# Patient Record
Sex: Male | Born: 1960 | Race: White | Hispanic: Yes | Marital: Married | Smoking: Never smoker
Health system: Southern US, Community
[De-identification: ages and names within clinical notes are randomized; demographics above are authoritative.]

## PROBLEM LIST (undated history)

## (undated) DIAGNOSIS — I10 Essential (primary) hypertension: Secondary | ICD-10-CM

## (undated) DIAGNOSIS — C8400 Mycosis fungoides, unspecified site: Secondary | ICD-10-CM

---

## 2008-04-16 ENCOUNTER — Ambulatory Visit: Payer: Self-pay | Admitting: Family Medicine

## 2008-04-16 ENCOUNTER — Ambulatory Visit (HOSPITAL_COMMUNITY): Admission: RE | Admit: 2008-04-16 | Discharge: 2008-04-16 | Payer: Self-pay | Admitting: Family Medicine

## 2008-04-16 DIAGNOSIS — E785 Hyperlipidemia, unspecified: Secondary | ICD-10-CM

## 2008-04-16 DIAGNOSIS — I10 Essential (primary) hypertension: Secondary | ICD-10-CM

## 2008-04-16 DIAGNOSIS — R21 Rash and other nonspecific skin eruption: Secondary | ICD-10-CM | POA: Insufficient documentation

## 2008-04-23 ENCOUNTER — Encounter: Payer: Self-pay | Admitting: Family Medicine

## 2008-04-27 ENCOUNTER — Encounter (INDEPENDENT_AMBULATORY_CARE_PROVIDER_SITE_OTHER): Payer: Self-pay | Admitting: Family Medicine

## 2008-04-27 ENCOUNTER — Ambulatory Visit: Payer: Self-pay | Admitting: Family Medicine

## 2008-04-30 LAB — CONVERTED CEMR LAB
Albumin: 4.6 g/dL (ref 3.5–5.2)
Alkaline Phosphatase: 96 units/L (ref 39–117)
BUN: 22 mg/dL (ref 6–23)
Calcium: 9.8 mg/dL (ref 8.4–10.5)
Chloride: 100 meq/L (ref 96–112)
Creatinine, Ser: 1.06 mg/dL (ref 0.40–1.50)
Glucose, Bld: 84 mg/dL (ref 70–99)
HDL: 41 mg/dL (ref >39)
Potassium: 4 meq/L (ref 3.5–5.3)
Total CHOL/HDL Ratio: 5.6
Triglycerides: 123 mg/dL (ref <150)

## 2009-02-04 ENCOUNTER — Ambulatory Visit: Payer: Self-pay | Admitting: Family Medicine

## 2009-02-04 DIAGNOSIS — M79609 Pain in unspecified limb: Secondary | ICD-10-CM

## 2009-02-15 ENCOUNTER — Ambulatory Visit: Payer: Self-pay | Admitting: Family Medicine

## 2009-02-15 ENCOUNTER — Telehealth: Payer: Self-pay | Admitting: Family Medicine

## 2009-02-15 DIAGNOSIS — M5412 Radiculopathy, cervical region: Secondary | ICD-10-CM | POA: Insufficient documentation

## 2010-06-16 ENCOUNTER — Encounter: Payer: Self-pay | Admitting: Family Medicine

## 2010-07-09 ENCOUNTER — Encounter: Payer: Self-pay | Admitting: *Deleted

## 2016-10-27 ENCOUNTER — Ambulatory Visit (HOSPITAL_COMMUNITY)
Admission: EM | Admit: 2016-10-27 | Discharge: 2016-10-27 | Disposition: A | Discharge status: Home / Self Care | Payer: Managed Care, Other (non HMO) | Attending: Internal Medicine | Admitting: Internal Medicine

## 2016-10-27 ENCOUNTER — Encounter (HOSPITAL_COMMUNITY): Payer: Self-pay | Admitting: Family Medicine

## 2016-10-27 DIAGNOSIS — M5431 Sciatica, right side: Secondary | ICD-10-CM | POA: Diagnosis not present

## 2016-10-27 HISTORY — DX: Essential (primary) hypertension: I10

## 2016-10-27 MED ORDER — PREDNISONE 10 MG PO TABS: ORAL_TABLET | ORAL | 0 refills | Status: AC

## 2016-10-27 MED ORDER — HYDROCODONE-ACETAMINOPHEN 5-325 MG PO TABS
2.0000 | ORAL_TABLET | ORAL | 0 refills | Status: DC | PRN
Start: 2016-10-27 — End: 2018-05-29

## 2016-10-27 NOTE — ED Provider Notes (Signed)
CSN: [id number]     Arrival date & time 10/27/16  1458 History   None    Chief Complaint  Patient presents with  . Leg Pain   (Consider location/radiation/quality/duration/timing/severity/associated sxs/prior Treatment) The history is provided by the patient. No language interpreter was used.  Leg Pain  Location:  Hip and buttock Time since incident:  2 months Injury: no   Hip location:  R hip Buttock location:  R buttock Pain details:    Quality:  Aching   Radiates to:  Does not radiate   Severity:  Moderate   Onset quality:  Gradual   Duration:  2 months   Timing:  Constant Chronicity:  New Relieved by:  Nothing Worsened by:  Nothing Associated symptoms: no back pain    Pt reports pain goes down his leg,  Pt reports pain in his buttock.  Past Medical History:  Diagnosis Date  . Hypertension    History reviewed. No pertinent surgical history. History reviewed. No pertinent family history. Social History  Substance Use Topics  . Smoking status: Never Smoker  . Smokeless tobacco: Never Used  . Alcohol use Not on file    Review of Systems  Musculoskeletal: Negative for back pain.  All other systems reviewed and are negative.   Allergies  Patient has no known allergies.  Home Medications   Prior to Admission medications   Medication Sig Start Date End Date Taking? Authorizing Provider  gabapentin (NEURONTIN) 100 MG capsule Take 100 mg by mouth. At bedtime for 2-3 days then 2 at bedtime for 2-3 days then 3 at bedtime     [provider]  HYDROcodone-acetaminophen (NORCO/VICODIN) 5-325 MG tablet Take 2 tablets by mouth every 4 (four) hours as needed. 10/27/16   Fransico Meadow, PA-C  ibuprofen (ADVIL,MOTRIN) 600 MG tablet Take 600 mg by mouth every 8 (eight) hours.      [provider]  lisinopril-hydrochlorothiazide (PRINZIDE,ZESTORETIC) 20-25 MG per tablet Take 1 tablet by mouth daily.      [provider]  predniSONE (DELTASONE) 10 MG  tablet 6,5,4,3,2,1 taper 10/27/16   Caryl Ada K, PA-C  triamcinolone (KENALOG) 0.1 % ointment Apply topically 2 (two) times daily. To arms over rash. 34 grm     [provider]   Meds Ordered and Administered this Visit  Medications - No data to display  BP 127/87   Pulse 92   Temp 98.5 F (36.9 C)   Resp 18   SpO2 100%  No data found.   Physical Exam  Constitutional: He appears well-developed and well-nourished.  Cardiovascular: Normal rate.   Pulmonary/Chest: Effort normal.  Musculoskeletal: He exhibits tenderness.  Tender sciatic notch,   Pt has a large wallet in pocket on right side,  From  nv and ns intact  Neurological: He is alert.  Skin: Skin is warm.  Psychiatric: He has a normal mood and affect.  Nursing note and vitals reviewed.   Urgent Care Course     Procedures (including critical care time)  Labs Review Labs Reviewed - No data to display  Imaging Review No results found.   Visual Acuity Review  Right Eye Distance:   Left Eye Distance:   Bilateral Distance:    Right Eye Near:   Left Eye Near:    Bilateral Near:         MDM   1. Sciatica of right side    Pt advised to stop carrying/sitting on wallet,  Pt counseled on sciatica.  Meds ordered this encounter  Medications  . predniSONE (DELTASONE) 10 MG tablet    Sig: 6,5,4,3,2,1 taper    Dispense:  21 tablet    Refill:  0    Order Specific Question:   Supervising Provider    Answer:   Sherlene Shams [[id number]]  . HYDROcodone-acetaminophen (NORCO/VICODIN) 5-325 MG tablet    Sig: Take 2 tablets by mouth every 4 (four) hours as needed.    Dispense:  16 tablet    Refill:  0    Order Specific Question:   Supervising Provider    Answer:   Sherlene Shams [[id number]]   An After Visit Summary was printed and given to the patient. Schedule to see Dr. Percell Miller for evaluation.    Fransico Meadow, [place] 10/27/16 [phone]

## 2016-10-27 NOTE — Discharge Instructions (Signed)
Do not keep your wallet in your back pocket

## 2016-10-27 NOTE — ED Triage Notes (Signed)
Pt here for right leg pain that is worsening overt the past 2 months.

## 2016-12-31 ENCOUNTER — Ambulatory Visit: Payer: Managed Care, Other (non HMO) | Attending: Family Medicine | Admitting: Licensed Clinical Social Worker

## 2016-12-31 ENCOUNTER — Encounter: Payer: Self-pay | Admitting: Family Medicine

## 2016-12-31 ENCOUNTER — Ambulatory Visit: Payer: Managed Care, Other (non HMO) | Attending: Family Medicine | Admitting: Family Medicine

## 2016-12-31 VITALS — BP 163/96 | HR 82 | Temp 98.0°F | Resp 18 | Ht 68.0 in | Wt 163.4 lb

## 2016-12-31 DIAGNOSIS — Z0000 Encounter for general adult medical examination without abnormal findings: Secondary | ICD-10-CM

## 2016-12-31 DIAGNOSIS — Z0001 Encounter for general adult medical examination with abnormal findings: Secondary | ICD-10-CM | POA: Insufficient documentation

## 2016-12-31 DIAGNOSIS — Z8249 Family history of ischemic heart disease and other diseases of the circulatory system: Secondary | ICD-10-CM | POA: Insufficient documentation

## 2016-12-31 DIAGNOSIS — Z79899 Other long term (current) drug therapy: Secondary | ICD-10-CM | POA: Insufficient documentation

## 2016-12-31 DIAGNOSIS — F419 Anxiety disorder, unspecified: Secondary | ICD-10-CM

## 2016-12-31 DIAGNOSIS — L400 Psoriasis vulgaris: Secondary | ICD-10-CM | POA: Diagnosis not present

## 2016-12-31 DIAGNOSIS — I10 Essential (primary) hypertension: Secondary | ICD-10-CM

## 2016-12-31 MED ORDER — CLOBETASOL PROPIONATE 0.05 % EX CREA: 1.0000 "application " | TOPICAL_CREAM | Freq: Two times a day (BID) | CUTANEOUS | 1 refills | Status: AC

## 2016-12-31 MED ORDER — LISINOPRIL-HYDROCHLOROTHIAZIDE 20-25 MG PO TABS: 1.0000 | ORAL_TABLET | Freq: Every day | ORAL | 3 refills | Status: AC

## 2016-12-31 MED ORDER — CITALOPRAM HYDROBROMIDE 10 MG PO TABS: 10.0000 mg | ORAL_TABLET | Freq: Every day | ORAL | 1 refills | Status: AC

## 2016-12-31 NOTE — Progress Notes (Signed)
Patient is here for physical/HTN

## 2016-12-31 NOTE — BH Specialist Note (Signed)
Integrated Behavioral Health Initial Visit  MRN: [id number] Name: Nathan Francis   Session Start time: 11:45 Session End time: 12:00 PM Total time: 15 minutes  Type of Service: [place] Interpretor:No. Interpretor Name and Language: N/A   Warm Hand Off Completed.       SUBJECTIVE: Nathan Francis is a 56 y.o. male accompanied by patient. Patient was referred by FNP [place] for anxiety. Patient reports the following symptoms/concerns: feeling worried, difficulty sleeping, and low energy Duration of problem: A month; Severity of problem: mild  OBJECTIVE: Mood: Pleasant and Affect: Appropriate Risk of harm to self or others: No plan to harm self or others   LIFE CONTEXT: Family and Social: Pt receives support from wife and children. He has a sister who resides in [place], [place] that he visits with School/Work: Pt is employed Self-Care: Pt talks with spouse. No report of substance use Life Changes: None indicated  GOALS ADDRESSED: Patient will reduce symptoms of: anxiety and increase knowledge and/or ability of: coping skills and also: Increase adequate support systems for patient/family   INTERVENTIONS: Mindfulness or Relaxation Training, Supportive Counseling and Psychoeducation and/or Health Education  Standardized Assessments completed: GAD-7 and PHQ 2&9  ASSESSMENT: Patient currently experiencing anxiety. He reports feeling worried, difficulty sleeping, and low energy. Patient may benefit from psychotherapy. LCSWA educated pt on the correlation between one's physical and mental health. LCSWA discussed relaxation techniques that can assist in decreasing symptoms. Pt receives support from family. He is not receptive to initiating medication management or psychotherapy at this time. Crisis resources were discussed.   PLAN: 1. Follow up with behavioral health clinician on : Pt was encouraged to contact LCSWA if symptoms worsen or fail to  improve to schedule behavioral appointments at [facility]. 2. Behavioral recommendations: LCSWA recommends that pt apply healthy coping skills discussed. Pt is encouraged to schedule follow up appointment with LCSWA 3. Referral(s):  4. "From scale of 1-10, how likely are you to follow plan?": 10/10  Rebekah Chesterfield, LCSW 01/01/17 2:04 PM

## 2016-12-31 NOTE — Patient Instructions (Addendum)
Psoriasis Psoriasis is a long-term (chronic) skin condition. It causes raised, red patches (plaques) on your skin that look silvery. The red patches may show up anywhere on your body. They can be any size or shape. Psoriasis can come and go. It can range from mild to very bad. It cannot be passed from one person to another (not contagious). There is no cure for this condition, but it can be helped with treatment. Follow these instructions at home: Skin Care  Apply moisturizers to your skin as needed. Only use those that your doctor has said are okay.  Apply cool compresses to the affected areas.  Do not scratch your skin. Lifestyle   Do not use tobacco products. This includes cigarettes, chewing tobacco, and e-cigarettes. If you need help quitting, ask your doctor.  Drink little or no alcohol.  Try to lower your stress. Meditation or yoga may help.  Get sun as told by your doctor. Do not get sunburned.  Think about joining a psoriasis support group. Medicines  Take or use over-the-counter and prescription medicines only as told by your doctor.  If you were prescribed an antibiotic, take or use it as told by your doctor. Do not stop taking the antibiotic even if your condition starts to get better. General instructions  Keep a journal. Use this to help track what triggers an outbreak. Try to avoid any triggers.  See a counselor or social worker if you feel very sad, upset, or hopeless about your condition and these feelings affect your work or relationships.  Keep all follow-up visits as told by your doctor. This is important. Contact a doctor if:  Your pain gets worse.  You have more redness or warmth in the affected areas.  You have new pain or stiffness in your joints.  Your pain or stiffness in your joints gets worse.  Your nails start to break easily.  Your nails pull away from the nail bed easily.  You have a fever.  You feel very sad (depressed). This  information is not intended to replace advice given to you by your health care provider. Make sure you discuss any questions you have with your health care provider. Document Released: 06/18/2004 Document Revised: 10/17/2015 Document Reviewed: 09/26/2014 Elsevier Interactive Patient Education  2018 [employer] After being diagnosed with an anxiety disorder, you may be relieved to know why you have felt or behaved a certain way. It is natural to also feel overwhelmed about the treatment ahead and what it will mean for your life. With care and support, you can manage this condition and recover from it. How to cope with anxiety Dealing with stress Stress is your body's reaction to life changes and events, both good and bad. Stress can last just a few hours or it can be ongoing. Stress can play a major role in anxiety, so it is important to learn both how to cope with stress and how to think about it differently. Talk with your health care provider or a counselor to learn more about stress reduction. He or she may suggest some stress reduction techniques, such as:  Music therapy. This can include creating or listening to music that you enjoy and that inspires you.  Mindfulness-based meditation. This involves being aware of your normal breaths, rather than trying to control your breathing. It can be done while sitting or walking.  Centering prayer. This is a kind of meditation that involves focusing on a word, phrase, or  sacred image that is meaningful to you and that brings you peace.  Deep breathing. To do this, expand your stomach and inhale slowly through your nose. Hold your breath for 3-5 seconds. Then exhale slowly, allowing your stomach muscles to relax.  Self-talk. This is a skill where you identify thought patterns that lead to anxiety reactions and correct those thoughts.  Muscle relaxation. This involves tensing muscles then relaxing them.  Choose a stress  reduction technique that fits your lifestyle and personality. Stress reduction techniques take time and practice. Set aside 5-15 minutes a day to do them. Therapists can offer training in these techniques. The training may be covered by some insurance plans. Other things you can do to manage stress include:  Keeping a stress diary. This can help you learn what triggers your stress and ways to control your response.  Thinking about how you respond to certain situations. You may not be able to control everything, but you can control your reaction.  Making time for activities that help you relax, and not feeling guilty about spending your time in this way.  Therapy combined with coping and stress-reduction skills provides the best chance for successful treatment. Medicines Medicines can help ease symptoms. Medicines for anxiety include:  Anti-anxiety drugs.  Antidepressants.  Beta-blockers.  Medicines may be used as the main treatment for anxiety disorder, along with therapy, or if other treatments are not working. Medicines should be prescribed by a health care provider. Relationships Relationships can play a big part in helping you recover. Try to spend more time connecting with trusted friends and family members. Consider going to couples counseling, taking family education classes, or going to family therapy. Therapy can help you and others better understand the condition. How to recognize changes in your condition Everyone has a different response to treatment for anxiety. Recovery from anxiety happens when symptoms decrease and stop interfering with your daily activities at home or work. This may mean that you will start to:  Have better concentration and focus.  Sleep better.  Be less irritable.  Have more energy.  Have improved memory.  It is important to recognize when your condition is getting worse. Contact your health care provider if your symptoms interfere with home or  work and you do not feel like your condition is improving. Where to find help and support: You can get help and support from these sources:  Self-help groups.  Online and [employer].  A trusted spiritual leader.  Couples counseling.  Family education classes.  Family therapy.  Follow these instructions at home:  Eat a healthy diet that includes plenty of vegetables, fruits, whole grains, low-fat dairy products, and lean protein. Do not eat a lot of foods that are high in solid fats, added sugars, or salt.  Exercise. Most adults should do the following: ? Exercise for at least 150 minutes each week. The exercise should increase your heart rate and make you sweat (moderate-intensity exercise). ? Strengthening exercises at least twice a week.  Cut down on caffeine, tobacco, alcohol, and other potentially harmful substances.  Get the right amount and quality of sleep. Most adults need 7-9 hours of sleep each night.  Make choices that simplify your life.  Take over-the-counter and prescription medicines only as told by your health care provider.  Avoid caffeine, alcohol, and certain over-the-counter cold medicines. These may make you feel worse. Ask your pharmacist which medicines to avoid.  Keep all follow-up visits as told by your health care  provider. This is important. Questions to ask your health care provider  Would I benefit from therapy?  How often should I follow up with a health care provider?  How long do I need to take medicine?  Are there any long-term side effects of my medicine?  Are there any alternatives to taking medicine? Contact a health care provider if:  You have a hard time staying focused or finishing daily tasks.  You spend many hours a day feeling worried about everyday life.  You become exhausted by worry.  You start to have headaches, feel tense, or have nausea.  You urinate more than normal.  You have diarrhea. Get help  right away if:  You have a racing heart and shortness of breath.  You have thoughts of hurting yourself or others. If you ever feel like you may hurt yourself or others, or have thoughts about taking your own life, get help right away. You can go to your nearest emergency department or call:  Your local emergency services (911 in the [place]  A suicide crisis helpline, such as the [place] at 406-358-4902. This is open 24-hours a day.  Summary  Taking steps to deal with stress can help calm you.  Medicines cannot cure anxiety disorders, but they can help ease symptoms.  Family, friends, and partners can play a big part in helping you recover from an anxiety disorder. This information is not intended to replace advice given to you by your health care provider. Make sure you discuss any questions you have with your health care provider. Document Released: 05/05/2016 Document Revised: 05/05/2016 Document Reviewed: 05/05/2016 Elsevier Interactive Patient Education  [employer].

## 2016-12-31 NOTE — Progress Notes (Signed)
Subjective:   Patient ID: Nathan Francis, male    DOB: [birth year], 58 y.o.   MRN: [id number]  Chief Complaint  Patient presents with  . Hypertension  . Annual Exam   HPI Nathan Francis 56 y.o. male presents for  a comprehensive physical exam. He reports history of psorasis. Symptoms have been ongoing for about 5 years and have been gradually worsening. The patient reports symptoms of scaling, primarily affecting the elbows, arms, knees, legs, trunk, abdomen, hip. Lesions appear to be exacerbated by no known precipitant. Treatments tried so far include moisturizers, with inadequate control of symptoms. He reports history of dermatology consult in the past and topical steroid use.  History of other significant skin problems: no. Family History of skin disease: no. History of hypertension. He is not exercising and is not adherent to low salt diet.  He does not check BP at home. Cardiac symptoms none. Patient denies chest pain, chest pressure/discomfort, claudication, dyspnea, lower extremity edema, near-syncope, palpitations and syncope.  Cardiovascular risk factors: advanced age (older than 69 for men, 66 for women), hypertension, male gender and smoking/ tobacco exposure. Use of agents associated with hypertension: none. History of target organ damage: none. Family history of HTN-mother,father. He denies any family history of diabetes or cancer.He complains of anxious mood.  He has the following symptoms: difficulty concentrating, feelings of losing control, racing thoughts. Onset of symptoms was approximately several years ago, unchanged since that time. He denies current suicidal and homicidal ideation.  Possible organic causes contributing are: none. Risk factors: none Previous treatment includes none . He is agreeable to speaking with LCSW at this time and is requesting medication for symptoms.   Past Medical History:  Diagnosis Date  . Hypertension     No past surgical history on file.  No family  history on file.  Social History   Social History  . Marital status: Married    Spouse name: N/A  . Number of children: N/A  . Years of education: N/A   Occupational History  . Not on file.   Social History Main Topics  . Smoking status: Never Smoker  . Smokeless tobacco: Never Used  . Alcohol use Not on file  . Drug use: Unknown  . Sexual activity: Not on file   Other Topics Concern  . Not on file   Social History Narrative  . No narrative on file    Outpatient Medications Prior to Visit  Medication Sig Dispense Refill  . gabapentin (NEURONTIN) 100 MG capsule Take 100 mg by mouth. At bedtime for 2-3 days then 2 at bedtime for 2-3 days then 3 at bedtime     . HYDROcodone-acetaminophen (NORCO/VICODIN) 5-325 MG tablet Take 2 tablets by mouth every 4 (four) hours as needed. 16 tablet 0  . ibuprofen (ADVIL,MOTRIN) 600 MG tablet Take 600 mg by mouth every 8 (eight) hours.      . predniSONE (DELTASONE) 10 MG tablet 6,5,4,3,2,1 taper 21 tablet 0  . triamcinolone (KENALOG) 0.1 % ointment Apply topically 2 (two) times daily. To arms over rash. 30 grm     . lisinopril-hydrochlorothiazide (PRINZIDE,ZESTORETIC) 20-25 MG per tablet Take 1 tablet by mouth daily.       No facility-administered medications prior to visit.     No Known Allergies  Review of Systems  Constitutional: Negative.   HENT: Negative.   Eyes: Negative.   Respiratory: Negative.   Cardiovascular: Negative.   Gastrointestinal: Negative.   Genitourinary: Negative.   Musculoskeletal: Negative.   Skin:  Positive for rash (psorasis).  Neurological: Negative.   Endo/Heme/Allergies: Negative.   Psychiatric/Behavioral: The patient is nervous/anxious.        Objective:    Physical Exam  Constitutional: He is oriented to person, place, and time. He appears well-developed and well-nourished.  HENT:  Head: Normocephalic and atraumatic.  Right Ear: External ear normal.  Left Ear: External ear normal.  Nose:  Nose normal.  Mouth/Throat: Oropharynx is clear and moist.  Eyes: Pupils are equal, round, and reactive to light. Conjunctivae and EOM are normal.  Neck: Normal range of motion. Neck supple.  Cardiovascular: Normal rate, regular rhythm, normal heart sounds and intact distal pulses.   Pulmonary/Chest: Effort normal and breath sounds normal.  Abdominal: Soft. Bowel sounds are normal.  Musculoskeletal: Normal range of motion.  Neurological: He is alert and oriented to person, place, and time. He has normal reflexes.  Skin: Skin is warm and dry. Rash (generalized red, scaly plaques) noted.  Psychiatric: He has a normal mood and affect. His behavior is normal. Judgment and thought content normal.  Nursing note and vitals reviewed.   BP (!) 163/96 (BP Location: Left Arm, Patient Position: Sitting, Cuff Size: Normal)   Pulse 82   Temp 98 F (36.7 C) (Oral)   Resp 18   Ht _0  (1.727 m)   Wt 163 lb 6.4 oz (74.1 kg)   SpO2 97%   BMI 24.84 kg/m  Wt Readings from Last 3 Encounters:  12/31/16 163 lb 6.4 oz (74.1 kg)  02/15/09 165 lb (74.8 kg)  02/04/09 170 lb (77.1 kg)      Lab Results  Component Value Date   TSH 2.770 12/31/2016   Lab Results  Component Value Date   WBC 7.2 12/31/2016   HGB 14.7 12/31/2016   HCT 44.9 12/31/2016   MCV 92 12/31/2016   PLT 373 12/31/2016   Lab Results  Component Value Date   NA 140 12/31/2016   K 4.7 12/31/2016   CO2 24 12/31/2016   GLUCOSE 81 12/31/2016   BUN 18 12/31/2016   CREATININE 1.07 12/31/2016   BILITOT 0.7 12/31/2016   ALKPHOS 89 12/31/2016   AST 18 12/31/2016   ALT 21 12/31/2016   PROT 7.2 12/31/2016   ALBUMIN 4.6 12/31/2016   CALCIUM 9.8 12/31/2016   Lab Results  Component Value Date   CHOL 223 (H) 12/31/2016   CHOL 229 (H) 04/27/2008   Lab Results  Component Value Date   HDL 40 12/31/2016   HDL 41 04/27/2008   Lab Results  Component Value Date   LDLCALC 156 (H) 12/31/2016   LDLCALC 163 (H) 04/27/2008   Lab  Results  Component Value Date   TRIG 136 12/31/2016   TRIG 123 04/27/2008   Lab Results  Component Value Date   CHOLHDL 5.6 (H) 12/31/2016   CHOLHDL 5.6 Ratio 04/27/2008   Lab Results  Component Value Date   HGBA1C 5.3 12/31/2016       Assessment & Plan:   Problem List Items Addressed This Visit      Cardiovascular and Mediastinum   Essential hypertension   Follow up with clinical pharmacist in 2 weeks.   Follow up with PCP in 8 weeks.   Relevant Medications   lisinopril-hydrochlorothiazide (PRINZIDE,ZESTORETIC) 20-25 MG tablet    Other Visit Diagnoses    Annual physical exam    -  Primary   Relevant Orders   CBC with Differential (Completed)   CMP14+EGFR (Completed)   Lipid Panel (Completed)  TSH (Completed)   Vitamin D, 25-hydroxy (Completed)   Hemoglobin A1c (Completed)   Plaque psoriasis       Relevant Medications   clobetasol cream (TEMOVATE) 0.05 %   Other Relevant Orders   Ambulatory referral to Dermatology   Anxious mood       LCSW spoke with patient and provided resources   Follow up with PCP in 8 weeks.   Relevant Medications   citalopram (CELEXA) 10 MG tablet   Health care maintenance       Relevant Orders   Ambulatory referral to Gastroenterology      Meds ordered this encounter  Medications  . lisinopril-hydrochlorothiazide (PRINZIDE,ZESTORETIC) 20-25 MG tablet    Sig: Take 1 tablet by mouth daily.    Dispense:  90 tablet    Refill:  3    Must have office visit for refills.    Order Specific Question:   Supervising Provider    Answer:   Tresa Garter [id number]  . clobetasol cream (TEMOVATE) 0.05 %    Sig: Apply 1 application topically 2 (two) times daily.    Dispense:  60 g    Refill:  1    Order Specific Question:   Supervising Provider    Answer:   Tresa Garter [id number]  . citalopram (CELEXA) 10 MG tablet    Sig: Take 1 tablet (10 mg total) by mouth daily.    Dispense:  30 tablet    Refill:  1    Order Specific  Question:   Supervising Provider    Answer:   Tresa Garter [id number]   Follow up: Return in about 2 weeks (around 01/14/2017), or if symptoms worsen or fail to improve, for BP check with Stacy.   Fredia Beets, FNP

## 2017-01-01 LAB — CBC WITH DIFFERENTIAL/PLATELET
BASOS: 0 %
Basophils Absolute: 0 10*3/uL (ref 0.0–0.2)
EOS (ABSOLUTE): 0.4 10*3/uL (ref 0.0–0.4)
Eos: 6 %
HEMOGLOBIN: 14.7 g/dL (ref 13.0–17.7)
Hematocrit: 44.9 % (ref 37.5–51.0)
IMMATURE GRANS (ABS): 0 10*3/uL (ref 0.0–0.1)
Immature Granulocytes: 0 %
LYMPHS ABS: 1 10*3/uL (ref 0.7–3.1)
LYMPHS: 14 %
MCH: 30 pg (ref 26.6–33.0)
MCHC: 32.7 g/dL (ref 31.5–35.7)
MCV: 92 fL (ref 79–97)
MONOCYTES: 7 %
Monocytes Absolute: 0.5 10*3/uL (ref 0.1–0.9)
NEUTROS ABS: 5.2 10*3/uL (ref 1.4–7.0)
Neutrophils: 73 %
Platelets: 373 10*3/uL (ref 150–379)
RBC: 4.9 x10E6/uL (ref 4.14–5.80)
RDW: 13.8 % (ref 12.3–15.4)
WBC: 7.2 10*3/uL (ref 3.4–10.8)

## 2017-01-01 LAB — HEMOGLOBIN A1C
Est. average glucose Bld gHb Est-mCnc: 105 mg/dL
Hgb A1c MFr Bld: 5.3 % (ref 4.8–5.6)

## 2017-01-01 LAB — CMP14+EGFR
A/G RATIO: 1.8 (ref 1.2–2.2)
ALBUMIN: 4.6 g/dL (ref 3.5–5.5)
ALT: 21 IU/L (ref 0–44)
AST: 18 IU/L (ref 0–40)
Alkaline Phosphatase: 89 IU/L (ref 39–117)
BILIRUBIN TOTAL: 0.7 mg/dL (ref 0.0–1.2)
BUN / CREAT RATIO: 17 (ref 9–20)
BUN: 18 mg/dL (ref 6–24)
CHLORIDE: 101 mmol/L (ref 96–106)
CO2: 24 mmol/L (ref 20–29)
Calcium: 9.8 mg/dL (ref 8.7–10.2)
Creatinine, Ser: 1.07 mg/dL (ref 0.76–1.27)
GFR calc non Af Amer: 77 mL/min/{1.73_m2} (ref >59)
GFR, EST AFRICAN AMERICAN: 89 mL/min/{1.73_m2} (ref >59)
Globulin, Total: 2.6 g/dL (ref 1.5–4.5)
Glucose: 81 mg/dL (ref 65–99)
POTASSIUM: 4.7 mmol/L (ref 3.5–5.2)
Sodium: 140 mmol/L (ref 134–144)
TOTAL PROTEIN: 7.2 g/dL (ref 6.0–8.5)

## 2017-01-01 LAB — LIPID PANEL
Chol/HDL Ratio: 5.6 ratio — ABNORMAL HIGH (ref 0.0–5.0)
Cholesterol, Total: 223 mg/dL — ABNORMAL HIGH (ref 100–199)
HDL: 40 mg/dL (ref >39)
LDL Calculated: 156 mg/dL — ABNORMAL HIGH (ref 0–99)
Triglycerides: 136 mg/dL (ref 0–149)
VLDL CHOLESTEROL CAL: 27 mg/dL (ref 5–40)

## 2017-01-01 LAB — VITAMIN D 25 HYDROXY (VIT D DEFICIENCY, FRACTURES): VIT D 25 HYDROXY: 29.1 ng/mL — AB (ref 30.0–100.0)

## 2017-01-01 LAB — TSH: TSH: 2.77 u[IU]/mL (ref 0.450–4.500)

## 2017-01-08 ENCOUNTER — Other Ambulatory Visit: Payer: Self-pay | Admitting: Family Medicine

## 2017-01-08 DIAGNOSIS — E782 Mixed hyperlipidemia: Secondary | ICD-10-CM

## 2017-01-08 MED ORDER — ATORVASTATIN CALCIUM 20 MG PO TABS: 20.0000 mg | ORAL_TABLET | Freq: Every day | ORAL | 2 refills | Status: AC

## 2017-01-12 ENCOUNTER — Telehealth: Payer: Self-pay

## 2017-01-12 NOTE — Telephone Encounter (Signed)
-----   Message from Nathan Francis, [place] sent at 01/08/2017  1:30 PM EDT ----- Labs that evaluated your blood cells, fluid and electrolyte balance are normal. No signs of anemia, infection, or inflammation present. Lipid levels were elevated. This can increase your risk of heart disease overtime. You will be prescribed atorvastatin to help lower risk  Start eating a diet low in saturated fat. Limit your intake of fried foods, red meats, and whole milk. Begin exercising at least 3-5 times per week for 30 minutes. Thyroid function normal Kidney function normal Liver function normal Negative diabetes Vitamin D level was low. Vitamin D helps to keep bones strong. Recommend taking over the counter vitamin D supplement or multivitamin with 400 IU of vitamin d daily.

## 2017-01-12 NOTE — Telephone Encounter (Signed)
CMA call regarding lab results   Patient did not answer wife answer left a message with her stating the reason of the call & to call me back

## 2017-01-14 ENCOUNTER — Encounter: Payer: Managed Care, Other (non HMO) | Admitting: Pharmacist

## 2017-01-15 ENCOUNTER — Telehealth: Payer: Self-pay | Admitting: Family Medicine

## 2017-01-15 NOTE — Telephone Encounter (Signed)
CMA call regarding lab results   Patient verify DOB    Patient was aware an understood

## 2017-01-15 NOTE — Telephone Encounter (Signed)
Pt called to speak with the nurse for lab result, please follow up

## 2017-02-04 ENCOUNTER — Ambulatory Visit: Payer: Managed Care, Other (non HMO) | Admitting: Pharmacist

## 2017-02-09 ENCOUNTER — Other Ambulatory Visit: Payer: Self-pay | Admitting: Family Medicine

## 2017-03-04 ENCOUNTER — Encounter: Payer: Self-pay | Admitting: Family Medicine

## 2017-08-27 ENCOUNTER — Telehealth: Payer: Self-pay | Admitting: Family Medicine

## 2017-08-27 NOTE — Telephone Encounter (Signed)
9 page, paperwork received through fax 08-27-17.

## 2018-05-28 ENCOUNTER — Emergency Department (HOSPITAL_COMMUNITY)
Admission: EM | Admit: 2018-05-28 | Discharge: 2018-05-29 | Disposition: A | Discharge status: Home / Self Care | Payer: Managed Care, Other (non HMO) | Attending: Emergency Medicine | Admitting: Emergency Medicine

## 2018-05-28 ENCOUNTER — Encounter (HOSPITAL_COMMUNITY): Payer: Self-pay

## 2018-05-28 DIAGNOSIS — R0789 Other chest pain: Secondary | ICD-10-CM | POA: Diagnosis not present

## 2018-05-28 DIAGNOSIS — M5416 Radiculopathy, lumbar region: Secondary | ICD-10-CM | POA: Diagnosis not present

## 2018-05-28 DIAGNOSIS — K2900 Acute gastritis without bleeding: Secondary | ICD-10-CM | POA: Diagnosis not present

## 2018-05-28 DIAGNOSIS — Z79899 Other long term (current) drug therapy: Secondary | ICD-10-CM | POA: Insufficient documentation

## 2018-05-28 DIAGNOSIS — I10 Essential (primary) hypertension: Secondary | ICD-10-CM | POA: Diagnosis not present

## 2018-05-28 DIAGNOSIS — M545 Low back pain: Secondary | ICD-10-CM | POA: Diagnosis present

## 2018-05-28 HISTORY — DX: Mycosis fungoides, unspecified site: C84.00

## 2018-05-28 NOTE — ED Triage Notes (Signed)
Onset yesterday right lower back pain radiating into right buttock and down right leg.   Pt has h/o back pain and took Robaxin he had on hand with no relief.   Pt states he took BP medication today.

## 2018-05-29 ENCOUNTER — Emergency Department (HOSPITAL_COMMUNITY)
Admit: 2018-05-29 | Discharge: 2018-05-29 | Disposition: A | Discharge status: Home / Self Care | Payer: Managed Care, Other (non HMO) | Attending: Emergency Medicine | Admitting: Emergency Medicine

## 2018-05-29 LAB — BASIC METABOLIC PANEL
Anion gap: 9 (ref 5–15)
BUN: 17 mg/dL (ref 6–20)
CO2: 26 mmol/L (ref 22–32)
CREATININE: 1.1 mg/dL (ref 0.61–1.24)
Calcium: 9.7 mg/dL (ref 8.9–10.3)
Chloride: 101 mmol/L (ref 98–111)
GFR calc Af Amer: >60 mL/min (ref >60)
Glucose, Bld: 113 mg/dL — ABNORMAL HIGH (ref 70–99)
Potassium: 4.1 mmol/L (ref 3.5–5.1)
Sodium: 136 mmol/L (ref 135–145)

## 2018-05-29 LAB — CBC
HCT: 49.2 % (ref 39.0–52.0)
Hemoglobin: 15.5 g/dL (ref 13.0–17.0)
MCH: 28.9 pg (ref 26.0–34.0)
MCHC: 31.5 g/dL (ref 30.0–36.0)
MCV: 91.6 fL (ref 80.0–100.0)
PLATELETS: 374 10*3/uL (ref 150–400)
RBC: 5.37 MIL/uL (ref 4.22–5.81)
RDW: 12.8 % (ref 11.5–15.5)
WBC: 12.4 10*3/uL — ABNORMAL HIGH (ref 4.0–10.5)
nRBC: 0 % (ref 0.0–0.2)

## 2018-05-29 LAB — HEPATIC FUNCTION PANEL
ALK PHOS: 82 U/L (ref 38–126)
ALT: 34 U/L (ref 0–44)
AST: 25 U/L (ref 15–41)
Albumin: 4.2 g/dL (ref 3.5–5.0)
Bilirubin, Direct: 0.1 mg/dL (ref 0.0–0.2)
Indirect Bilirubin: 0.9 mg/dL (ref 0.3–0.9)
Total Bilirubin: 1 mg/dL (ref 0.3–1.2)
Total Protein: 8.4 g/dL — ABNORMAL HIGH (ref 6.5–8.1)

## 2018-05-29 LAB — I-STAT TROPONIN, ED: Troponin i, poc: 0 ng/mL (ref 0.00–0.08)

## 2018-05-29 LAB — LIPASE, BLOOD: LIPASE: 30 U/L (ref 11–51)

## 2018-05-29 MED ORDER — PANTOPRAZOLE SODIUM 20 MG PO TBEC: 20.0000 mg | DELAYED_RELEASE_TABLET | Freq: Every day | ORAL | 0 refills | Status: AC

## 2018-05-29 MED ORDER — LIDOCAINE VISCOUS HCL 2 % MT SOLN
15.0000 mL | Freq: Once | OROMUCOSAL | Status: AC
  Administered 2018-05-29: 15 mL via ORAL
  Filled 2018-05-29: qty 15

## 2018-05-29 MED ORDER — ALUM & MAG HYDROXIDE-SIMETH 200-200-20 MG/5ML PO SUSP
30.0000 mL | Freq: Once | ORAL | Status: AC
  Administered 2018-05-29: 30 mL via ORAL
  Filled 2018-05-29: qty 30

## 2018-05-29 MED ORDER — HYDROCODONE-ACETAMINOPHEN 5-325 MG PO TABS
1.0000 | ORAL_TABLET | Freq: Once | ORAL | Status: AC
  Administered 2018-05-29: 1 via ORAL
  Filled 2018-05-29: qty 1

## 2018-05-29 MED ORDER — HYDROCODONE-ACETAMINOPHEN 5-325 MG PO TABS: 1.0000 | ORAL_TABLET | Freq: Four times a day (QID) | ORAL | 0 refills | Status: AC | PRN

## 2018-05-29 NOTE — ED Provider Notes (Signed)
[facility] EMERGENCY DEPARTMENT Provider Note   CSN: [id number] Arrival date & time: 05/28/18  2130     History   Chief Complaint Chief Complaint  Patient presents with  . Back Pain  . Chest Pain    HPI Nathan Francis is a 58 y.o. male.  HPI Patient presents with 3 days of pain radiating into his right buttock and down his right leg.  States he believes this is related to his chronic sciatica pain.  Denies any new trauma.  No numbness or weakness.  No incontinence.  He also has developed left-sided upper abdominal pain which radiates into his chest and into his back.  No nausea or vomiting.  No shortness of breath. Past Medical History:  Diagnosis Date  . Hypertension   . 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 CERVICAL RADICULOPATHY, LEFT 02/15/2009  . ARM PAIN 02/04/2009  . HYPERLIPIDEMIA 04/16/2008  . Essential hypertension 04/16/2008  . RASH AND OTHER NONSPECIFIC SKIN ERUPTION 04/16/2008    History reviewed. No pertinent surgical history.      Home Medications    Prior to Admission medications   Medication Sig Start Date End Date Taking? Authorizing Provider  atorvastatin (LIPITOR) 20 MG tablet Take 1 tablet (20 mg total) by mouth daily. 01/08/17   Alfonse Spruce, FNP  citalopram (CELEXA) 10 MG tablet Take 1 tablet (10 mg total) by mouth daily. 12/31/16   Alfonse Spruce, FNP  clobetasol cream (TEMOVATE) 6.96 % Apply 1 application topically 2 (two) times daily. 12/31/16   Alfonse Spruce, FNP  gabapentin (NEURONTIN) 100 MG capsule Take 100 mg by mouth. At bedtime for 2-3 days then 2 at bedtime for 2-3 days then 3 at bedtime     [provider]  HYDROcodone-acetaminophen (NORCO) 5-325 MG tablet Take 1 tablet by mouth every 6 (six) hours as needed for severe pain. 05/29/18   Julianne Rice, MD  lisinopril-hydrochlorothiazide (PRINZIDE,ZESTORETIC) 20-25 MG tablet Take 1 tablet by mouth  daily. 12/31/16   Alfonse Spruce, FNP  pantoprazole (PROTONIX) 20 MG tablet Take 1 tablet (20 mg total) by mouth daily. 05/29/18   Julianne Rice, MD  predniSONE (DELTASONE) 10 MG tablet 6,5,4,3,2,1 taper 10/27/16   Caryl Ada K, PA-C  triamcinolone (KENALOG) 0.1 % ointment Apply topically 2 (two) times daily. To arms over rash. 56 grm     [provider]    Family History History reviewed. No pertinent family history.  Social History Social History   Tobacco Use  . Smoking status: Never Smoker  . Smokeless tobacco: Never Used  Substance Use Topics  . Alcohol use: Not on file  . Drug use: Not on file     Allergies   Patient has no known allergies.   Review of Systems Review of Systems  Constitutional: Negative for chills and fever.  HENT: Negative for trouble swallowing.   Eyes: Negative for visual disturbance.  Respiratory: Negative for shortness of breath.   Cardiovascular: Positive for chest pain. Negative for palpitations.  Gastrointestinal: Positive for abdominal pain. Negative for diarrhea, nausea and vomiting.  Genitourinary: Negative for difficulty urinating, flank pain and hematuria.  Musculoskeletal: Positive for back pain. Negative for myalgias, neck pain and neck stiffness.  Skin: Negative for rash and wound.  Neurological: Negative for dizziness, weakness, light-headedness, numbness and headaches.  All other systems reviewed and are negative.    Physical Exam Updated Vital Signs BP Marland Kitchen)  154/89   Pulse 63   Temp 97.8 F (36.6 C) (Oral)   Resp 16   SpO2 94%   Physical Exam Vitals signs and nursing note reviewed.  Constitutional:      General: He is not in acute distress.    Appearance: Normal appearance. He is well-developed. He is not ill-appearing.  HENT:     Head: Normocephalic and atraumatic.     Nose: Nose normal.     Mouth/Throat:     Mouth: Mucous membranes are moist.     Pharynx: No oropharyngeal exudate or posterior  oropharyngeal erythema.  Eyes:     Extraocular Movements: Extraocular movements intact.     Pupils: Pupils are equal, round, and reactive to light.  Neck:     Musculoskeletal: Normal range of motion and neck supple. No neck rigidity or muscular tenderness.     Vascular: No carotid bruit.  Cardiovascular:     Rate and Rhythm: Normal rate and regular rhythm.     Heart sounds: No murmur. No friction rub. No gallop.   Pulmonary:     Effort: Pulmonary effort is normal. No respiratory distress.     Breath sounds: Normal breath sounds. No stridor. No wheezing, rhonchi or rales.  Chest:     Chest wall: No tenderness.  Abdominal:     General: Bowel sounds are normal.     Palpations: Abdomen is soft.     Tenderness: There is abdominal tenderness. There is no guarding or rebound.     Comments: Mild left upper quadrant tenderness to palpation.  No rebound or guarding.  Musculoskeletal: Normal range of motion.        General: No tenderness.     Comments: No midline thoracic or lumbar tenderness.  No CVA tenderness.  No lower extremity swelling, asymmetry or tenderness.  Distal pulses intact.  Lymphadenopathy:     Cervical: No cervical adenopathy.  Skin:    General: Skin is warm and dry.     Capillary Refill: Capillary refill takes less than 2 seconds.     Findings: No erythema or rash.  Neurological:     General: No focal deficit present.     Mental Status: He is alert and oriented to person, place, and time.     Comments: 5/5 motor in all extremities.  Sensation fully intact.  No saddle anesthesia.  Psychiatric:        Mood and Affect: Mood normal.        Behavior: Behavior normal.      ED Treatments / Results  Labs (all labs ordered are listed, but only abnormal results are displayed) Labs Reviewed  BASIC METABOLIC PANEL - Abnormal; Notable for the following components:      Result Value   Glucose, Bld 113 (*)    All other components within normal limits  CBC - Abnormal; Notable  for the following components:   WBC 12.4 (*)    All other components within normal limits  HEPATIC FUNCTION PANEL - Abnormal; Notable for the following components:   Total Protein 8.4 (*)    All other components within normal limits  LIPASE, BLOOD  I-STAT TROPONIN, ED    EKG EKG Interpretation  Date/Time:  Sunday May 29 2018 06:30:38 EST Ventricular Rate:  71 PR Interval:  132 QRS Duration: 78 QT Interval:  396 QTC Calculation: 430 R Axis:   70 Text Interpretation:  Normal sinus rhythm Normal ECG Confirmed by Julianne Rice [phone]) on 05/29/2018 7:34:46 AM   Radiology  Dg Chest 2 View  Result Date: 05/29/2018 CLINICAL DATA:  Acute onset of substernal chest pain, radiating to the back. EXAM: CHEST - 2 VIEW COMPARISON:  None. FINDINGS: The lungs are well-aerated. Peribronchial thickening is noted. There is no evidence of focal opacification, pleural effusion or pneumothorax. The heart is normal in size; the mediastinal contour is within normal limits. No acute osseous abnormalities are seen. IMPRESSION: Peribronchial thickening noted. Lungs otherwise clear. Electronically Signed   By: Garald Balding M.D.   On: 05/29/2018 06:57    Procedures Procedures (including critical care time)  Medications Ordered in ED Medications  alum & mag hydroxide-simeth (MAALOX/MYLANTA) 200-200-20 MG/5ML suspension 30 mL (30 mLs Oral Given 05/29/18 0806)    And  lidocaine (XYLOCAINE) 2 % viscous mouth solution 15 mL (15 mLs Oral Given 05/29/18 0806)  HYDROcodone-acetaminophen (NORCO/VICODIN) 5-325 MG per tablet 1 tablet (1 tablet Oral Given 05/29/18 7681)     Initial Impression / Assessment and Plan / ED Course  I have reviewed the triage vital signs and the nursing notes.  Pertinent labs & imaging results that were available during my care of the patient were reviewed by me and considered in my medical decision making (see chart for details).     Patient states that his abdominal pain has  completely resolved after GI cocktail.  States that his sciatica related pain is improved.  Low suspicion for coronary artery disease.  Single troponin sufficient to rule out MI.  EKG without ischemic changes.  Will start on PPI.  Advised to avoid spicy, acidic foods and NSAIDs.  Return precautions given. Final Clinical Impressions(s) / ED Diagnoses   Final diagnoses:  Lumbar radiculopathy  Acute gastritis without hemorrhage, unspecified gastritis type  Atypical chest pain    ED Discharge Orders         Ordered    pantoprazole (PROTONIX) 20 MG tablet  Daily     05/29/18 1032    HYDROcodone-acetaminophen (NORCO) 5-325 MG tablet  Every 6 hours PRN     05/29/18 1032           Julianne Rice, MD 05/29/18 [phone]

## 2018-05-29 NOTE — ED Notes (Addendum)
Patient is reporting pain in the right side from his hip down to his leg. He states "I think is sciatic pain." Family at bedside

## 2018-05-29 NOTE — ED Notes (Signed)
Pt in triage for reassessment abd c/o mid CP that radiates to the R chest onset x 3 days, EKG completed and blood work ordered

## 2018-05-29 NOTE — ED Notes (Signed)
D/c reviewed with patient and spouse 

## 2018-05-29 NOTE — Discharge Instructions (Addendum)
Avoid NSAIDs including ibuprofen and naproxen.

## 2018-05-29 NOTE — ED Notes (Signed)
Patient also reports left chest pain that started few days ago, he reports vomiting and "sweating a lot."

## 2019-06-01 IMAGING — CR DG CHEST 2V
2 series · 2 of 2 positions shown · non-contrast
Comparison: None.

CLINICAL DATA: Acute onset of substernal chest pain, radiating to
the back.

EXAM:
CHEST - 2 VIEW

[chest pa]
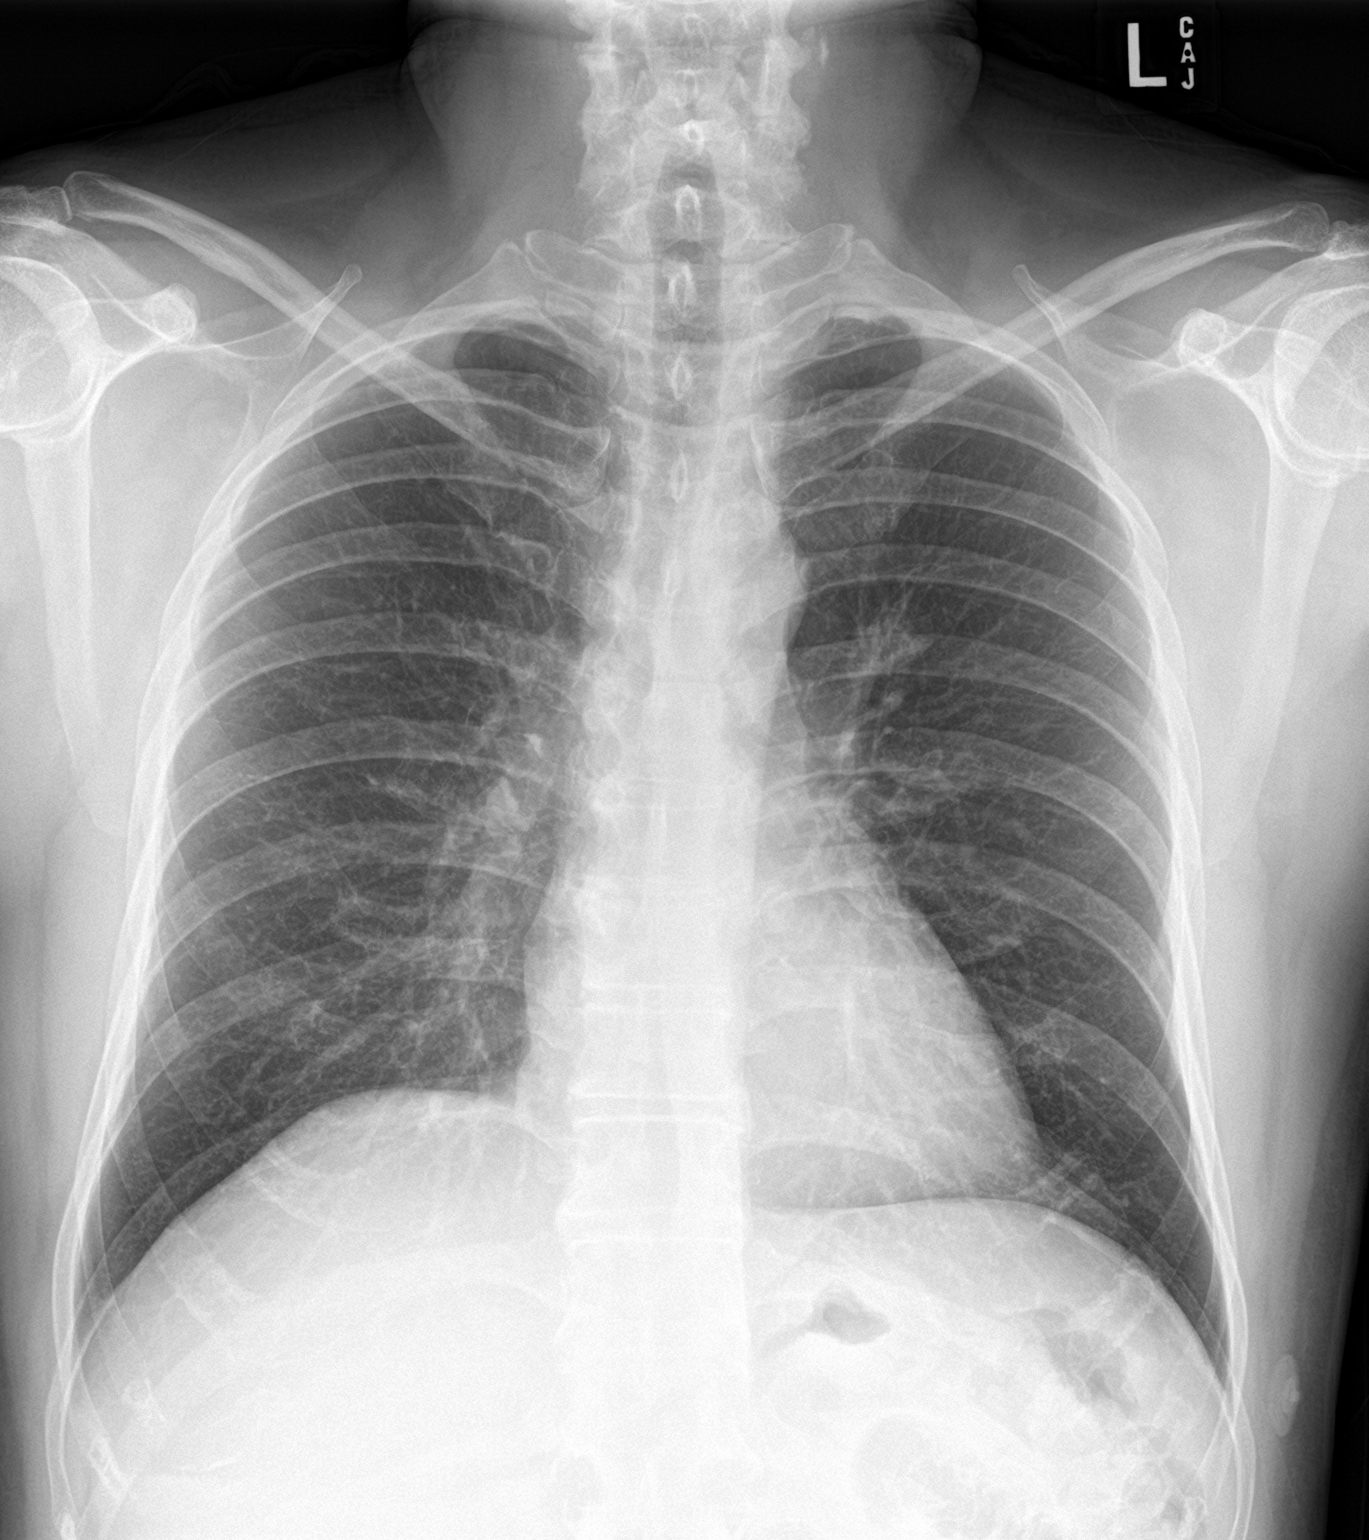

[chest lat]
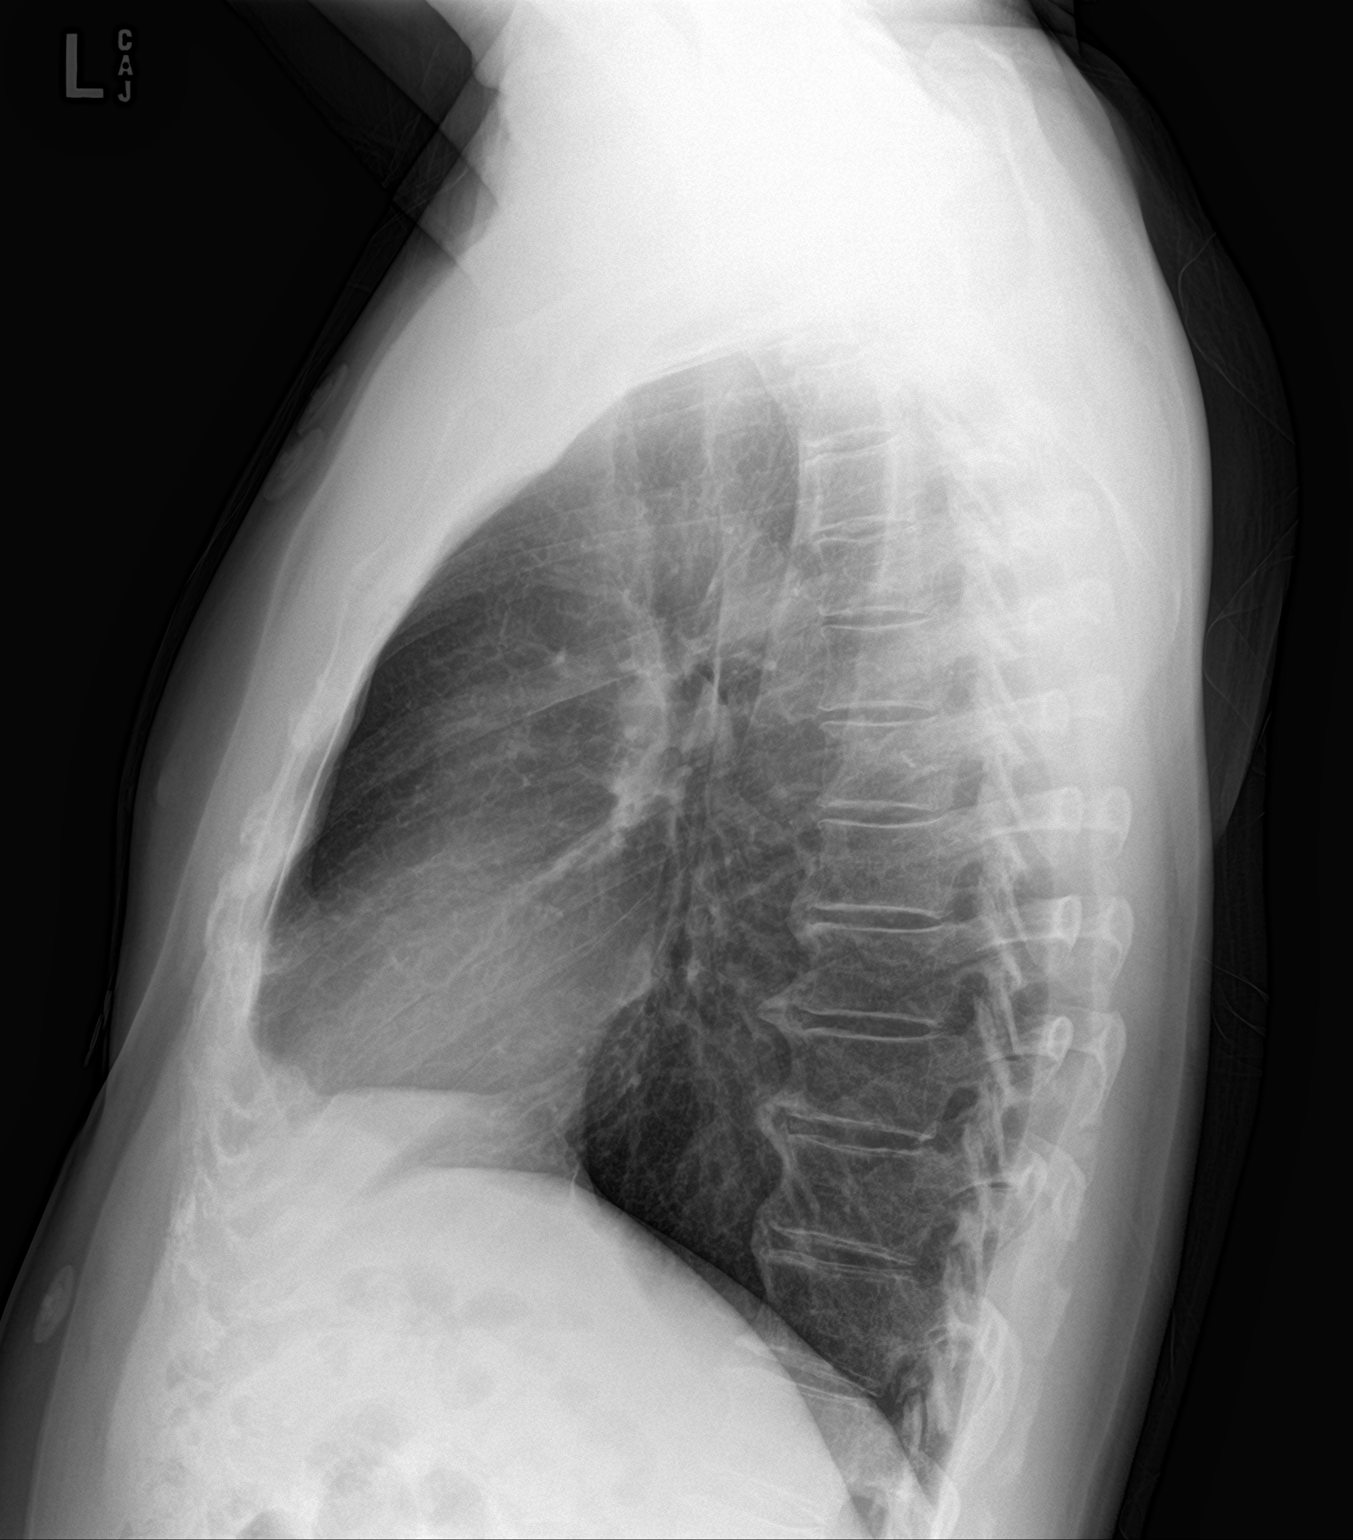

[2 of 2 positions shown; findings below may reference images not displayed]

FINDINGS: The lungs are well-aerated. Peribronchial thickening is noted. There
is no evidence of focal opacification, pleural effusion or
pneumothorax.

The heart is normal in size; the mediastinal contour is within
normal limits. No acute osseous abnormalities are seen.
IMPRESSION: Peribronchial thickening noted. Lungs otherwise clear.
